# Patient Record
Sex: Female | Born: 1935 | Race: White | Hispanic: No | Marital: Married | Smoking: Never smoker
Health system: Southern US, Community
[De-identification: ages and names within clinical notes are randomized; demographics above are authoritative.]

## PROBLEM LIST (undated history)

## (undated) DIAGNOSIS — I2510 Atherosclerotic heart disease of native coronary artery without angina pectoris: Secondary | ICD-10-CM

## (undated) DIAGNOSIS — I10 Essential (primary) hypertension: Secondary | ICD-10-CM

## (undated) HISTORY — PX: ABDOMINAL HYSTERECTOMY: SHX81

---

## 2013-11-04 ENCOUNTER — Emergency Department (INDEPENDENT_AMBULATORY_CARE_PROVIDER_SITE_OTHER)
Admission: EM | Admit: 2013-11-04 | Discharge: 2013-11-04 | Disposition: A | Discharge status: Home / Self Care | Payer: 59 | Source: Home / Self Care | Attending: Emergency Medicine | Admitting: Emergency Medicine

## 2013-11-04 ENCOUNTER — Emergency Department (INDEPENDENT_AMBULATORY_CARE_PROVIDER_SITE_OTHER): Payer: 59

## 2013-11-04 ENCOUNTER — Encounter (HOSPITAL_COMMUNITY): Payer: Self-pay | Admitting: Emergency Medicine

## 2013-11-04 DIAGNOSIS — W010XXA Fall on same level from slipping, tripping and stumbling without subsequent striking against object, initial encounter: Secondary | ICD-10-CM

## 2013-11-04 DIAGNOSIS — S8001XA Contusion of right knee, initial encounter: Secondary | ICD-10-CM

## 2013-11-04 DIAGNOSIS — S8000XA Contusion of unspecified knee, initial encounter: Secondary | ICD-10-CM

## 2013-11-04 HISTORY — DX: Essential (primary) hypertension: I10

## 2013-11-04 HISTORY — DX: Atherosclerotic heart disease of native coronary artery without angina pectoris: I25.10

## 2013-11-04 MED ORDER — TRAMADOL HCL 50 MG PO TABS: 100.0000 mg | ORAL_TABLET | Freq: Three times a day (TID) | ORAL | Status: AC | PRN

## 2013-11-04 NOTE — ED Notes (Signed)
Larey SeatFell last week when she lost her balance , and has had pain in her right knee and hip since then. [facility], and reportedly had all negative x-ray reports, but did not have studies done on her right leg

## 2013-11-04 NOTE — Discharge Instructions (Signed)
Knee pain can be caused by many conditions:  Osteoarthritis, gout, bursitis, tendonitis, cartiledge damage, condromalacia patella, patellofemoral syndrome, and ligament sprain to name just a few.  Often some simple conservative measures can help alleviate the pain. ° °Do not do the following: °· Avoid squatting and doing deep knee bends.  This puts too much of load on your cartiledges and tendons.  If you do a knee bend, go only half way down, flexing your knee no more than 90 degrees. ° °Do the following: °· If you are overweight or obese, lose weight.  This makes for a lot less load on your knee joints. °· If you use tobacco, quit.  Nicotine causes spasm of the small arteries, decreases blood flow, and impairs your body's normal ability to repair damage. °· If your knee is acutely inflamed, use the principles of RICE (rest, ice, compression, and elevation). °· Wearing a knee brace can help.  These are usually made of neoprene and can be purchased over the counter at the drug store. °· Use of over the counter pain meds can be of help.  Tylenol (or acetaminophen) is the safest to use.  It often helps to take this regularly.  You can take up to 2 325 mg tablets 5 times daily, but it best to start out much lower that that, perhaps 2 325 mg tablets twice daily, then increase from there. People who are on the blood thinner warfarin have to be careful about taking high doses of Tylenol.  For people who are able to tolerate them, ibuprofen and naproxyn can also help with the pain.  You should discuss these agents with your physician before taking them.  People with chronic kidney disease, hypertension, peptic ulcer disease, and reflux can suffer adverse side effects. They should not be taken with warfarin. The maximum dosage of ibuprofen is 800 mg 3 times daily with meals.  The maximum dosage of naprosyn is 2 and 1/2 tablets twice daily with food, but again, start out low and gradually increase the dose until adequate  pain relief is achieved. Ibuprofen and naprosyn should always be taken with food. °· People with cartiledge injury or osteoarthritis may find glucosamine to be helpful.  This is an over-the-counter supplement that helps nourish and repair cartiledge.  The dose is 500 mg 3 times daily or 1500 mg taken in a single dose. This can take several months to work and it doesn't always work.   °· For people with knee pain on just one side, use of a cane held in the hand on the same side as the knee pain takes some of the stress off the knee joint and can make a big difference in knee pain. °· Wearing good shoes with adequate arch support is essential. °· Regular exercise is of utmost importance.  Swimming, water aerobics, or use of an elliptical exerciser put the least stress on the knees of any exercise. °· Finally doing the exercises below can be very helpful.  They tend to strengthen the muscles around the knee and provide extra support and stability.  Try to do them twice a day followed by ice for 10 minutes. ° ° ° ° ° ° °

## 2013-11-04 NOTE — ED Provider Notes (Signed)
Chief Complaint   Chief Complaint  Patient presents with  . Fall    History of Present Illness   Tracy Fleming is an 80 year old female [place] [facility], when she was attacked by a bumblebee. In trying to get away from the bee she slipped and fell backwards. She caught herself with her right arm. She did not hit her head and there was no loss of consciousness. Immediately after the accident she had pain in her right wrist, right hip, right knee, right lower leg. She [facility] urgent [facility] and their phone number there is [phone]. X-rays of the pelvis, the right hip, and the right wrist were obtained which were negative. She was placed in a wrist splint. The wrist is getting better, however she now has pain in the right knee and right lower leg. She does not have much pain in the hip or the pelvis area. She denies any headache, neck pain, back, chest, or abdominal pain, and she has no pain in the left arm or left leg. There is no numbness, tingling, or weakness. She's had no nausea or vomiting.  Review of Systems   Other than as noted above, the patient denies any of the following symptoms: ENT:  No headache, facial pain, or bleeding from the nose or ears.  No loose or broken teeth. Neck:  No neck pain or stiffnes. Cardiac:  No chest pain. No palpitations, dizziness, syncope or fainting. GI:  No abdominal pain. No nausea, vomiting, or diarrhea. M-S:  No extremity pain, swelling, bruising, limited ROM, or back pain. Neuro:  No loss of consciousness, seizure activity, dizziness, vertigo, paresthesias, numbness, or weakness.  No difficulty with speech or ambulation.  PMFSH   Past medical history, family history, social history, meds, and allergies were reviewed.  She's allergic to sulfa, penicillin, erythromycin. She has high blood pressure and takes clonidine and she was [place] mel[place]am [place].  Physical Examination    Vital signs:  BP 169/76  Pulse 87  Temp(Src) 97.9 F (36.6 C) (Oral)  SpO2 100% General:  Alert, oriented and in no distress. Eye:  PERRL, full EOMs. ENT:  No cranial or facial tenderness to palpation. Neck:  No tenderness to palpation.  Full ROM without pain. Heart:  Regular rhythm.  No extrasystoles, gallops, or murmers. Lungs:  No chest wall tenderness to palpation. Breath sounds clear and equal bilaterally.  No wheezes, rales or rhonchi. Abdomen:  Non tender. Back:  Non tender to palpation.  Full ROM without pain. Extremities:  There is minimal tenderness to palpation over the pretibial area and over the medial and lateral joint lines of the knee. There is no swelling or deformity. The knee has a full range of motion with slight pain.  Full ROM of all joints without pain.  Pulses full.  Brisk capillary refill. Neuro:  Alert and oriented times 3.  Cranial nerves intact.  No muscle weakness.  Sensation intact to light touch.  Gait normal. Skin:  No bruising, abrasions, or lacerations.  Radiology   Dg Tibia/fibula Right  11/04/2013   CLINICAL DATA:  Status post fall 1 week ago.  Right lower leg pain.  EXAM: RIGHT TIBIA AND FIBULA - 2 VIEW  COMPARISON:  None.  FINDINGS: Imaged bones, joints and soft tissues appear normal.  IMPRESSION: Negative exam.   Electronically Signed   By: Drusilla Kannerhomas  Dalessio  M.D.   On: 11/04/2013 13:01   Dg Knee Complete 4 Views Right  11/04/2013   CLINICAL DATA:  Fall 1 week ago.  Persistent right knee pain.  EXAM: RIGHT KNEE - COMPLETE 4+ VIEW  COMPARISON:  None.  FINDINGS: There is no evidence of fracture, dislocation, or joint effusion. There is no evidence of arthropathy or other focal bone abnormality. Peripheral vascular calcification noted.  IMPRESSION: No acute findings.  Mild peripheral vascular calcification noted.   Electronically Signed   By: Myles RosenthalJohn  Stahl M.D.   On: 11/04/2013 13:02    Course in Urgent Care Center    The patient was given a knee sleeve. [place]. The official reading on the negative for both the right wrist, right hip, and pelvis.  Assessment   The encounter diagnosis was Contusion of right knee, initial encounter.  No evidence of fracture.  Plan   1.  Meds:  The following meds were prescribed:   Discharge Medication List as of 11/04/2013  1:35 PM    START taking these medications   Details  traMADol (ULTRAM) 50 MG tablet Take 2 tablets (100 mg total) by mouth every 8 (eight) hours as needed., Starting 11/04/2013, Until Discontinued, Print        2.  Patient Education/Counseling:  The patient was given appropriate handouts, self care instructions, and instructed in symptomatic relief.  Followup with orthopedist if no improvement after one week.  3.  Follow up:  The patient was told to follow up here if no better in 3 to 4 days, or sooner if becoming worse in any way, and given some red flag symptoms such as increasing pain or new neurological symptoms which would prompt immediate return.  Follow up here if necessary.     Reuben Likesavid C Malyiah Fellows, MD 11/04/13 [phone]

## 2015-05-26 IMAGING — CR DG KNEE COMPLETE 4+V*R*
4 series · 4 of 4 positions shown · non-contrast
Comparison: None.

CLINICAL DATA: Fall 1 week ago.  Persistent right knee pain.

EXAM:
RIGHT KNEE - COMPLETE 4+ VIEW

[view not recorded (1 of 4)]
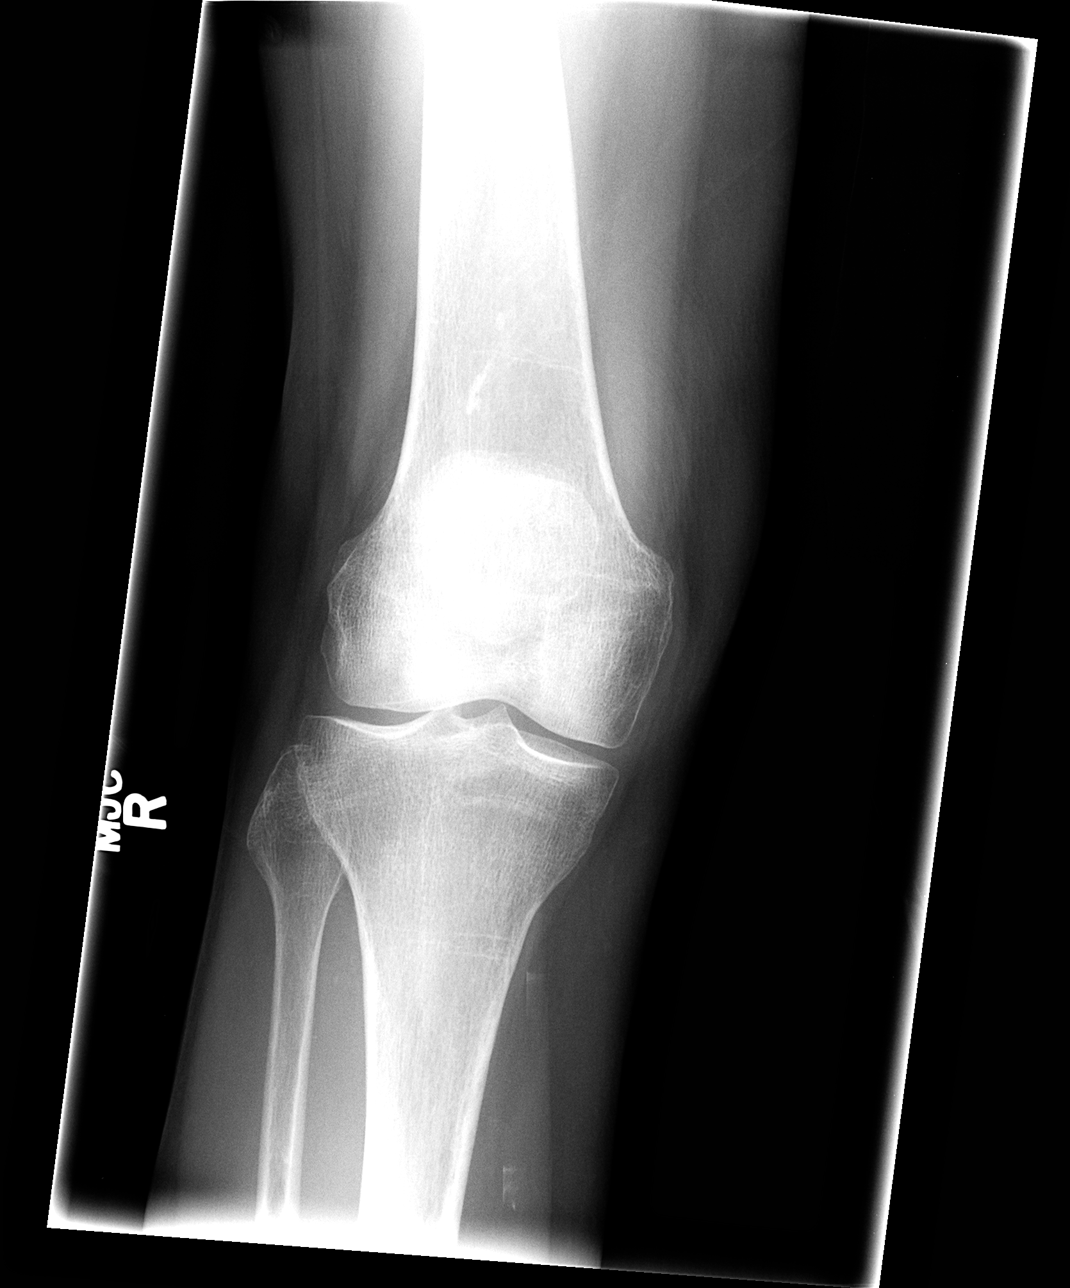

[view not recorded (2 of 4)]
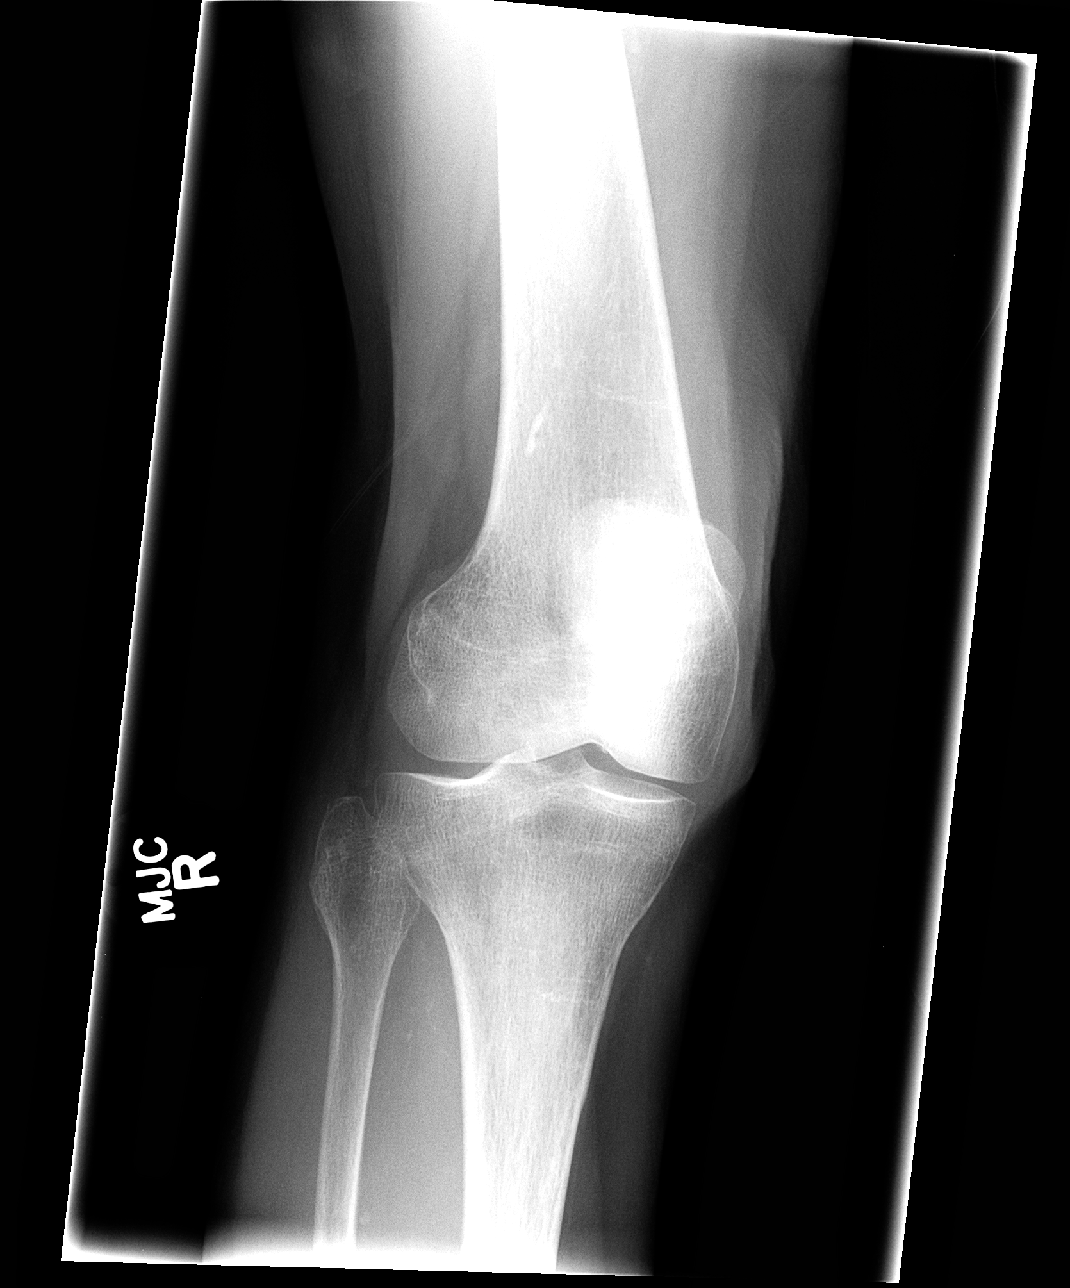

[view not recorded (3 of 4)]
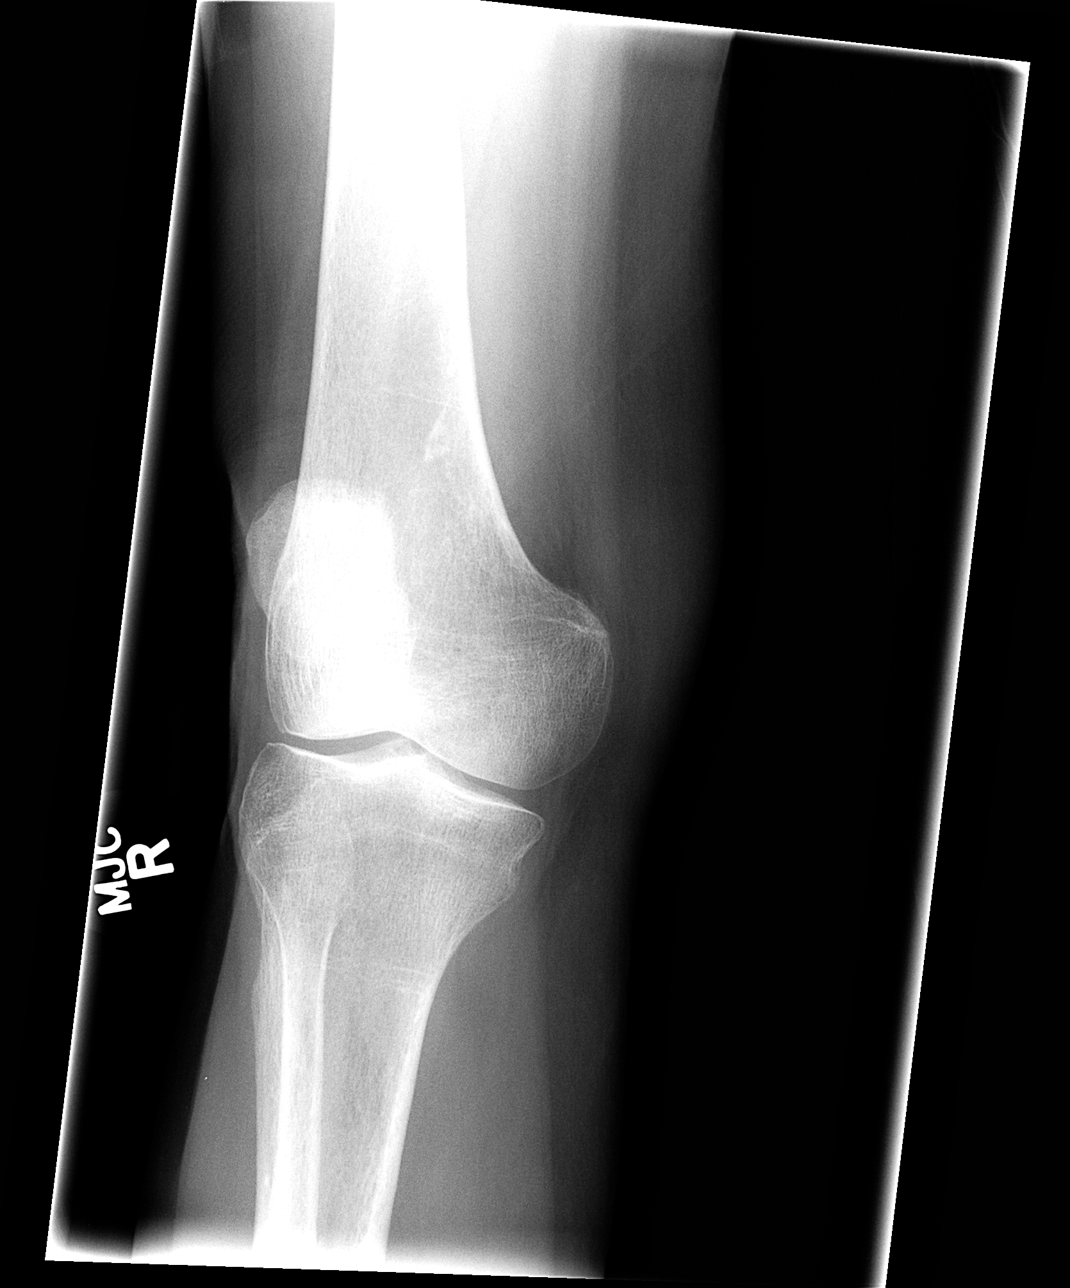

[view not recorded (4 of 4)]
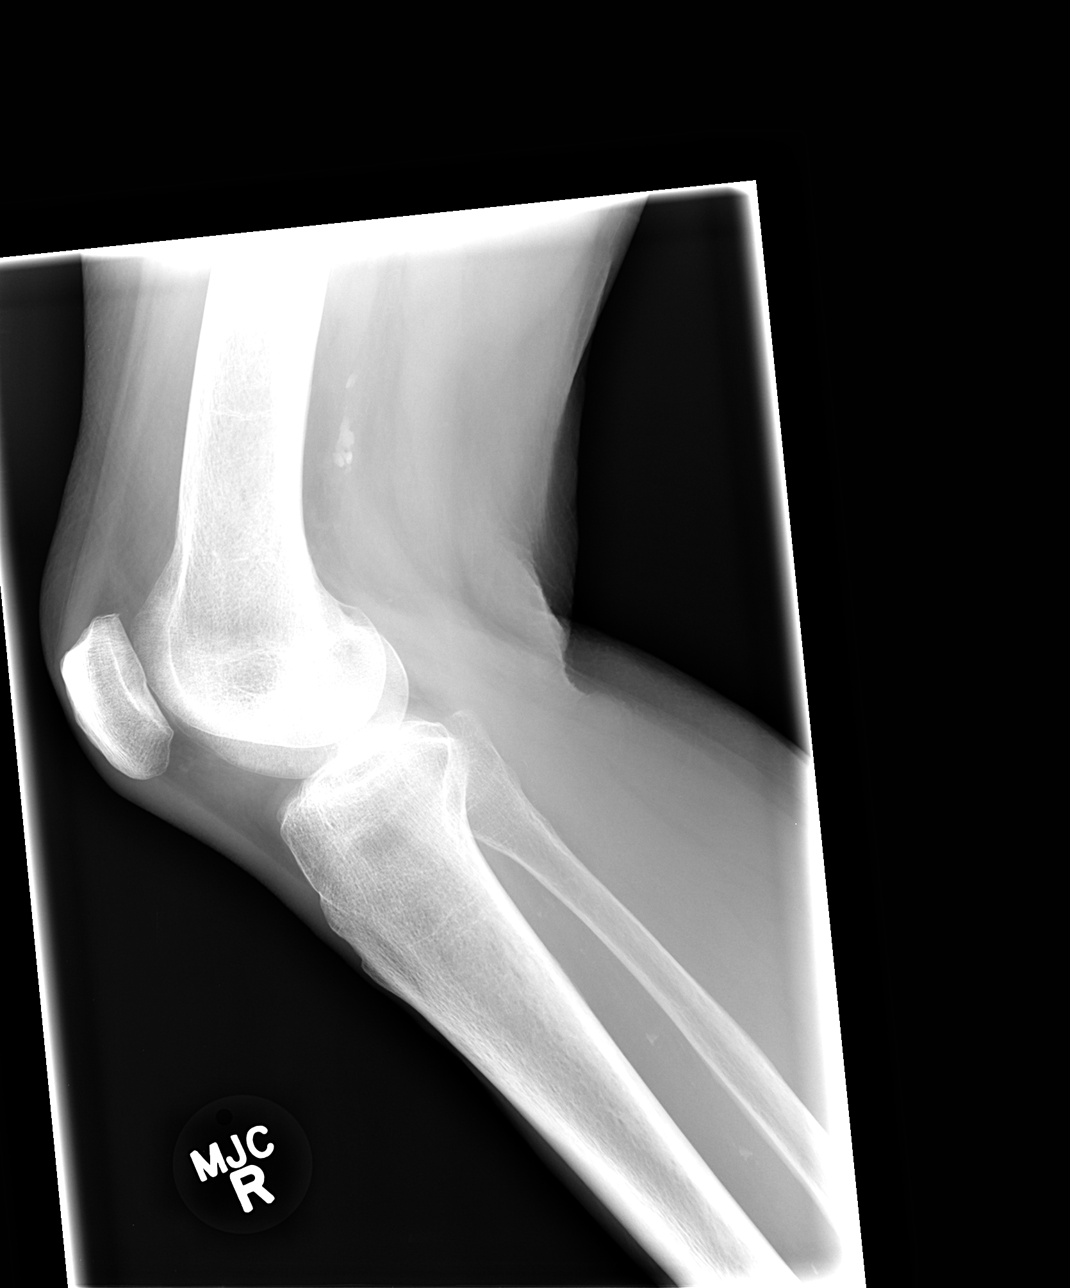

[4 of 4 positions shown; findings below may reference images not displayed]

FINDINGS: There is no evidence of fracture, dislocation, or joint effusion.
There is no evidence of arthropathy or other focal bone abnormality.
Peripheral vascular calcification noted.
IMPRESSION: No acute findings.  Mild peripheral vascular calcification noted.
